# Patient Record
Sex: Male | Born: 1995 | Race: White | Hispanic: No | Marital: Single | Smoking: Never smoker
Health system: Southern US, Community
[De-identification: ages and names within clinical notes are randomized; demographics above are authoritative.]

## PROBLEM LIST (undated history)

## (undated) DIAGNOSIS — F419 Anxiety disorder, unspecified: Secondary | ICD-10-CM

## (undated) HISTORY — DX: Anxiety disorder, unspecified: F41.9

## (undated) HISTORY — PX: OTHER SURGICAL HISTORY: SHX169

---

## 2016-06-09 ENCOUNTER — Emergency Department: Payer: BLUE CROSS/BLUE SHIELD

## 2016-06-09 ENCOUNTER — Encounter: Payer: Self-pay | Admitting: Emergency Medicine

## 2016-06-09 ENCOUNTER — Emergency Department
Admission: EM | Admit: 2016-06-09 | Discharge: 2016-06-09 | Disposition: A | Discharge status: Home / Self Care | Payer: BLUE CROSS/BLUE SHIELD | Attending: Emergency Medicine | Admitting: Emergency Medicine

## 2016-06-09 DIAGNOSIS — K529 Noninfective gastroenteritis and colitis, unspecified: Secondary | ICD-10-CM | POA: Insufficient documentation

## 2016-06-09 DIAGNOSIS — R1031 Right lower quadrant pain: Secondary | ICD-10-CM | POA: Diagnosis present

## 2016-06-09 LAB — CBC
HCT: 45.7 % (ref 40.0–52.0)
Hemoglobin: 16.1 g/dL (ref 13.0–18.0)
MCH: 31.1 pg (ref 26.0–34.0)
MCHC: 35.2 g/dL (ref 32.0–36.0)
MCV: 88.4 fL (ref 80.0–100.0)
Platelets: 259 10*3/uL (ref 150–440)
RBC: 5.17 MIL/uL (ref 4.40–5.90)
RDW: 13.8 % (ref 11.5–14.5)
WBC: 13.9 10*3/uL — ABNORMAL HIGH (ref 3.8–10.6)

## 2016-06-09 LAB — URINALYSIS, COMPLETE (UACMP) WITH MICROSCOPIC
BILIRUBIN URINE: NEGATIVE
Bacteria, UA: NONE SEEN
Glucose, UA: NEGATIVE mg/dL
KETONES UR: NEGATIVE mg/dL
LEUKOCYTES UA: NEGATIVE
Nitrite: NEGATIVE
Protein, ur: 30 mg/dL — AB
Specific Gravity, Urine: 1.026 (ref 1.005–1.030)
Squamous Epithelial / LPF: NONE SEEN
pH: 5 (ref 5.0–8.0)

## 2016-06-09 LAB — COMPREHENSIVE METABOLIC PANEL
ALT: 13 U/L — ABNORMAL LOW (ref 17–63)
AST: 21 U/L (ref 15–41)
Albumin: 5.2 g/dL — ABNORMAL HIGH (ref 3.5–5.0)
Alkaline Phosphatase: 93 U/L (ref 38–126)
Anion gap: 9 (ref 5–15)
BUN: 14 mg/dL (ref 6–20)
CO2: 26 mmol/L (ref 22–32)
Calcium: 9.7 mg/dL (ref 8.9–10.3)
Chloride: 104 mmol/L (ref 101–111)
Creatinine, Ser: 1 mg/dL (ref 0.61–1.24)
GFR calc Af Amer: >60 mL/min (ref >60)
GFR calc non Af Amer: >60 mL/min (ref >60)
Glucose, Bld: 114 mg/dL — ABNORMAL HIGH (ref 65–99)
Potassium: 4.1 mmol/L (ref 3.5–5.1)
Sodium: 139 mmol/L (ref 135–145)
Total Bilirubin: 1.6 mg/dL — ABNORMAL HIGH (ref 0.3–1.2)
Total Protein: 8.5 g/dL — ABNORMAL HIGH (ref 6.5–8.1)

## 2016-06-09 LAB — LIPASE, BLOOD: LIPASE: 14 U/L (ref 11–51)

## 2016-06-09 MED ORDER — METRONIDAZOLE 500 MG PO TABS
500.0000 mg | ORAL_TABLET | Freq: Once | ORAL | Status: AC
  Administered 2016-06-09: 500 mg via ORAL
  Filled 2016-06-09: qty 1

## 2016-06-09 MED ORDER — ONDANSETRON 4 MG PO TBDP
4.0000 mg | ORAL_TABLET | Freq: Once | ORAL | Status: AC | PRN
  Administered 2016-06-09: 4 mg via ORAL
  Filled 2016-06-09: qty 1

## 2016-06-09 MED ORDER — CIPROFLOXACIN HCL 500 MG PO TABS
500.0000 mg | ORAL_TABLET | Freq: Once | ORAL | Status: AC
  Administered 2016-06-09: 500 mg via ORAL
  Filled 2016-06-09: qty 1

## 2016-06-09 MED ORDER — CIPROFLOXACIN HCL 500 MG PO TABS: 500.0000 mg | ORAL_TABLET | Freq: Two times a day (BID) | ORAL | 0 refills | Status: DC

## 2016-06-09 MED ORDER — IOPAMIDOL (ISOVUE-300) INJECTION 61%
30.0000 mL | Freq: Once | INTRAVENOUS | Status: AC | PRN
  Administered 2016-06-09: 30 mL via ORAL
  Filled 2016-06-09: qty 30

## 2016-06-09 MED ORDER — SODIUM CHLORIDE 0.9 % IV BOLUS (SEPSIS)
1000.0000 mL | Freq: Once | INTRAVENOUS | Status: AC
  Administered 2016-06-09: 1000 mL via INTRAVENOUS

## 2016-06-09 MED ORDER — METRONIDAZOLE 500 MG PO TABS: 500.0000 mg | ORAL_TABLET | Freq: Two times a day (BID) | ORAL | 0 refills | Status: DC

## 2016-06-09 MED ORDER — ONDANSETRON HCL 8 MG PO TABS: 8.0000 mg | ORAL_TABLET | Freq: Three times a day (TID) | ORAL | 0 refills | Status: DC | PRN

## 2016-06-09 MED ORDER — ONDANSETRON HCL 4 MG/2ML IJ SOLN
4.0000 mg | Freq: Once | INTRAMUSCULAR | Status: AC
  Administered 2016-06-09: 4 mg via INTRAVENOUS
  Filled 2016-06-09: qty 2

## 2016-06-09 MED ORDER — IOPAMIDOL (ISOVUE-300) INJECTION 61%
100.0000 mL | Freq: Once | INTRAVENOUS | Status: AC | PRN
  Administered 2016-06-09: 100 mL via INTRAVENOUS
  Filled 2016-06-09: qty 100

## 2016-06-09 NOTE — Discharge Instructions (Signed)
Liquid diet for 24 hours. May progress from soft to solid foods as tolerated.

## 2016-06-09 NOTE — ED Triage Notes (Signed)
N/v/d since 4 am. States no one iin the household sick at this time. Pt sitting comfortably in triage

## 2016-06-09 NOTE — ED Provider Notes (Signed)
[facility] Emergency Department Provider Note   ____________________________________________   None    (approximate)  I have reviewed the triage vital signs and the nursing notes.   HISTORY  Chief Complaint Nausea and Diarrhea    HPI Shane ChandlerJake Anderson Romero is a 21 y.o. male patient complaining of nausea vomiting diarrhea since 4 AM this morning. Patient states there is mild right lower quadrant pain. Patient state last vomiting and loose stools prior to arrival to this department. Palliative measures for this complaint. Patient stated nofamily is sick. Patient had taken a flu shot this season. No palliative measures prior to arrival. Patient rates his pain as a 4/10.   History reviewed. No pertinent past medical history.  There are no active problems to display for this patient.   History reviewed. No pertinent surgical history.  Prior to Admission medications   Medication Sig Start Date End Date Taking? Authorizing Provider  ciprofloxacin (CIPRO) 500 MG tablet Take 1 tablet (500 mg total) by mouth 2 (two) times daily. 06/09/16   Joni Reiningonald K Keymari Sato, PA-C  metroNIDAZOLE (FLAGYL) 500 MG tablet Take 1 tablet (500 mg total) by mouth 2 (two) times daily. 06/09/16   Joni Reiningonald K Dameion Briles, PA-C  ondansetron (ZOFRAN) 8 MG tablet Take 1 tablet (8 mg total) by mouth every 8 (eight) hours as needed for nausea or vomiting. 06/09/16   Joni Reiningonald K Kiyah Demartini, PA-C    Allergies Patient has no known allergies.  History reviewed. No pertinent family history.  Social History Social History  Substance Use Topics  . Smoking status: Never Smoker  . Smokeless tobacco: Never Used  . Alcohol use No    Review of Systems Constitutional: No fever/chills Eyes: No visual changes. ENT: No sore throat. Cardiovascular: Denies chest pain. Respiratory: Denies shortness of breath. Gastrointestinal: Mild mid and right lower quadrant abdominal pain.  Nausea vomiting and diarrhea  No  constipation. Genitourinary: Negative for dysuria. Musculoskeletal: Negative for back pain. Skin: Negative for rash. Neurological: Negative for headaches, focal weakness or numbness.    ____________________________________________   PHYSICAL EXAM:  VITAL SIGNS: ED Triage Vitals  Enc Vitals Group     BP 06/09/16 1442 119/85     Pulse Rate 06/09/16 1442 (!) 117     Resp 06/09/16 1442 20     Temp 06/09/16 1442 98.3 F (36.8 C)     Temp Source 06/09/16 1442 Oral     SpO2 06/09/16 1442 100 %     Weight 06/09/16 1442 130 lb (59 kg)     Height 06/09/16 1442 6\' 2"  (1.88 m)     Head Circumference --      Peak Flow --      Pain Score 06/09/16 1514 4     Pain Loc --      Pain Edu? --      Excl. in GC? --     Constitutional: Alert and oriented. Well appearing and in no acute distress. Eyes: Conjunctivae are normal. PERRL. EOMI. Head: Atraumatic. Nose: No congestion/rhinnorhea. Mouth/Throat: Mucous membranes are moist.  Oropharynx non-erythematous. Neck: No stridor.  No cervical spine tenderness to palpation. Hematological/Lymphatic/Immunilogical: No cervical lymphadenopathy. Cardiovascular: Tachycardic. Grossly normal heart sounds.  Good peripheral circulation. Respiratory: Normal respiratory effort.  No retractions. Lungs CTAB. Gastrointestinal: Soft and nontender. No distention. No abdominal bruits. No CVA tenderness. Musculoskeletal: No lower extremity tenderness nor edema.  No joint effusions. Neurologic:  Normal speech and language. No gross focal neurologic deficits are appreciated. No gait instability. Skin:  Skin is warm, dry and intact. No rash noted. Psychiatric: Mood and affect are normal. Speech and behavior are normal.  ____________________________________________   LABS (all labs ordered are listed, but only abnormal results are displayed)  Labs Reviewed  COMPREHENSIVE METABOLIC PANEL - Abnormal; Notable for the following:       Result Value   Glucose, Bld  114 (*)    Total Protein 8.5 (*)    Albumin 5.2 (*)    ALT 13 (*)    Total Bilirubin 1.6 (*)    All other components within normal limits  CBC - Abnormal; Notable for the following:    WBC 13.9 (*)    All other components within normal limits  URINALYSIS, COMPLETE (UACMP) WITH MICROSCOPIC - Abnormal; Notable for the following:    Color, Urine YELLOW (*)    APPearance CLEAR (*)    Hgb urine dipstick MODERATE (*)    Protein, ur 30 (*)    All other components within normal limits  LIPASE, BLOOD   ____________________________________________  EKG   ____________________________________________  RADIOLOGY  CT scan with contrast of abdomen is consistent with colitis. ____________________________________________   PROCEDURES  Procedure(s) performed: None  Procedures  Critical Care performed: No  ____________________________________________   INITIAL IMPRESSION / ASSESSMENT AND PLAN / ED COURSE  Pertinent labs & imaging results that were available during my care of the patient were reviewed by me and considered in my medical decision making (see chart for details).  Colitis. Patient given discharge care instructions. Patient given a prescription for Flagyl and Cipro. Patient advised to follow-up with gastroenterologist if no improvement in 3-5 days. Return to ER if condition worsens.      ____________________________________________   FINAL CLINICAL IMPRESSION(S) / ED DIAGNOSES  Final diagnoses:  Colitis      NEW MEDICATIONS STARTED DURING THIS VISIT:  New Prescriptions   CIPROFLOXACIN (CIPRO) 500 MG TABLET    Take 1 tablet (500 mg total) by mouth 2 (two) times daily.   METRONIDAZOLE (FLAGYL) 500 MG TABLET    Take 1 tablet (500 mg total) by mouth 2 (two) times daily.   ONDANSETRON (ZOFRAN) 8 MG TABLET    Take 1 tablet (8 mg total) by mouth every 8 (eight) hours as needed for nausea or vomiting.     Note:  This document was prepared using Dragon voice  recognition software and may include unintentional dictation errors.    Joni Reining, PA-C 06/09/16 1719    Governor Rooks, MD 06/11/16 [phone]

## 2017-08-17 IMAGING — CT CT ABD-PELV W/ CM
2 of 4 series · 15 of 46 positions shown, 17 images · IV contrast (APPLIED)
Comparison: None.

CLINICAL DATA: Nausea vomiting and diarrhea

EXAM:
CT ABDOMEN AND PELVIS WITH CONTRAST
TECHNIQUE: Multidetector CT imaging of the abdomen and pelvis was performed
using the standard protocol following bolus administration of
intravenous contrast.
CONTRAST:  100mL RFUKUL-666 IOPAMIDOL (RFUKUL-666) INJECTION 61%

[Series 2: routine abd/pel with · axial · 0.63mm/px · z∈[-420,-40]mm · 12 of 90 slices shown, 14 images]
[im 7/90  soft-tissue]
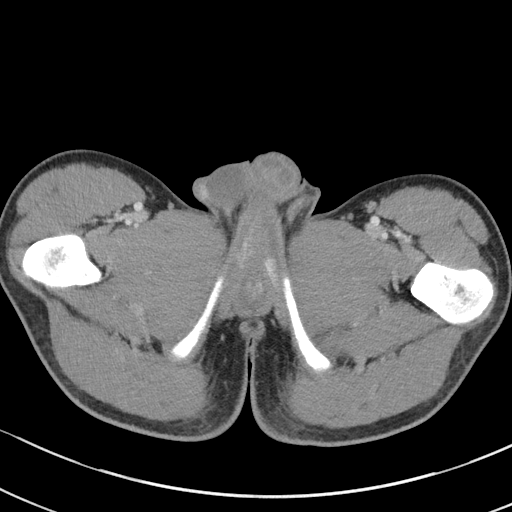
[im 7/90  bone]
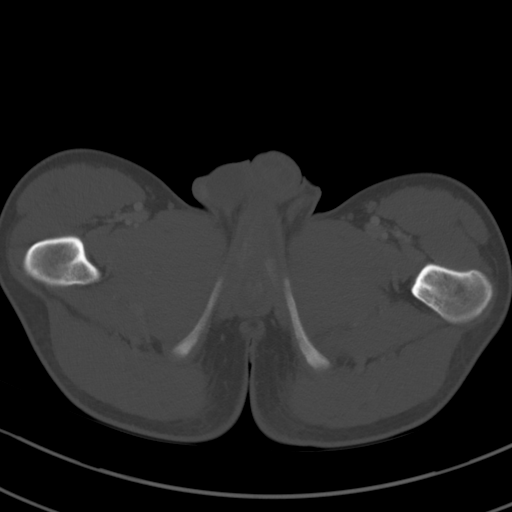
[im 14/90  soft-tissue]
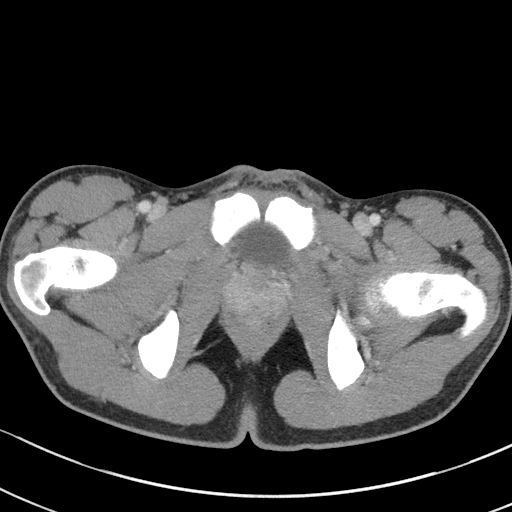
[im 20/90  soft-tissue]
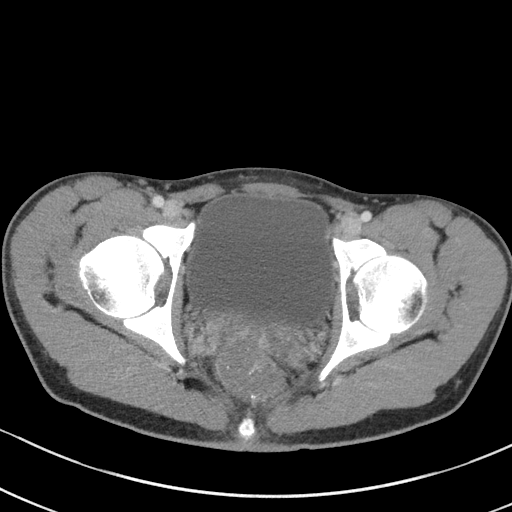
[im 27/90  soft-tissue]
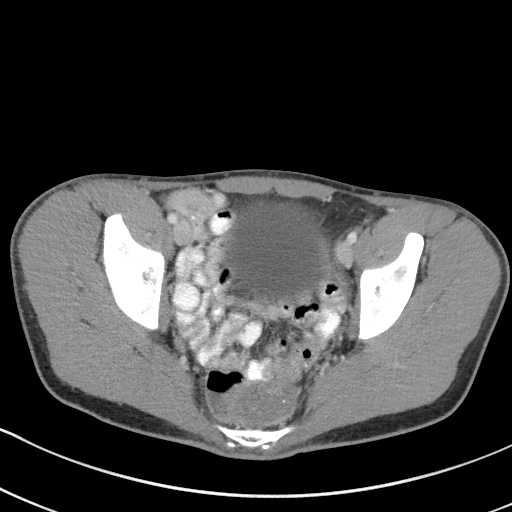
[im 33/90  soft-tissue]
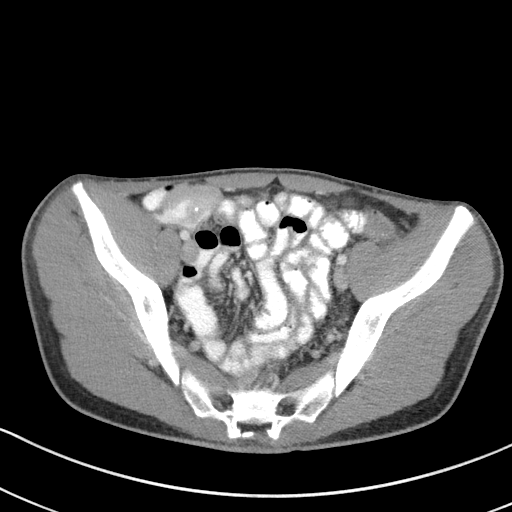
[im 40/90  soft-tissue]
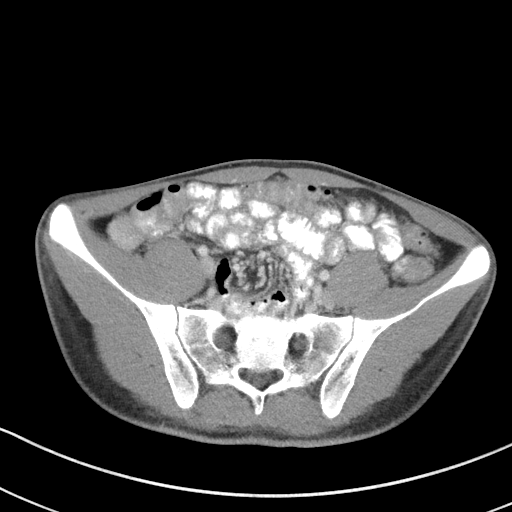
[im 50/90  soft-tissue]
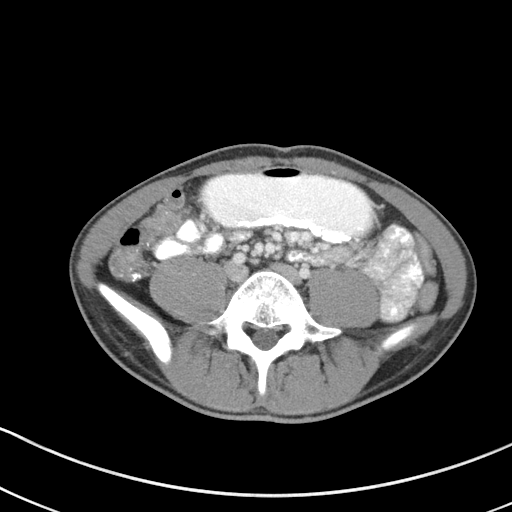
[im 57/90  soft-tissue]
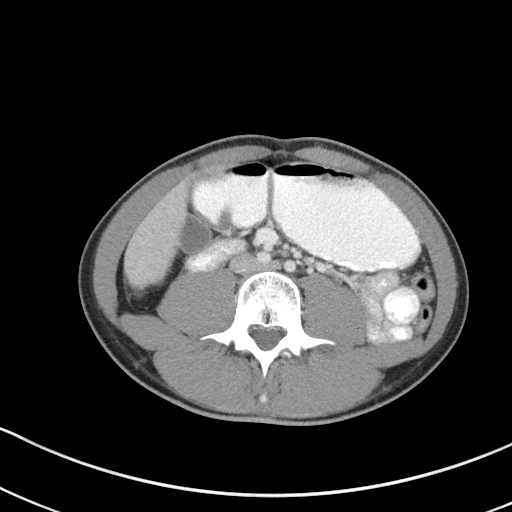
[im 63/90  soft-tissue]
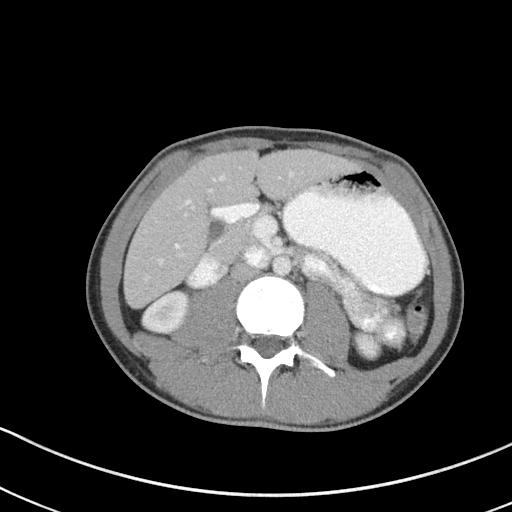
[im 63/90  bone]
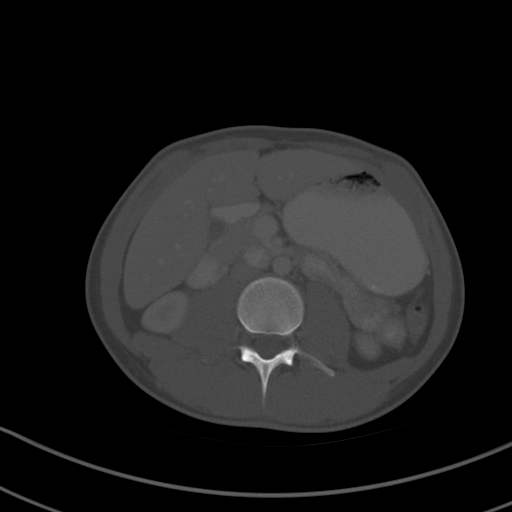
[im 70/90  soft-tissue]
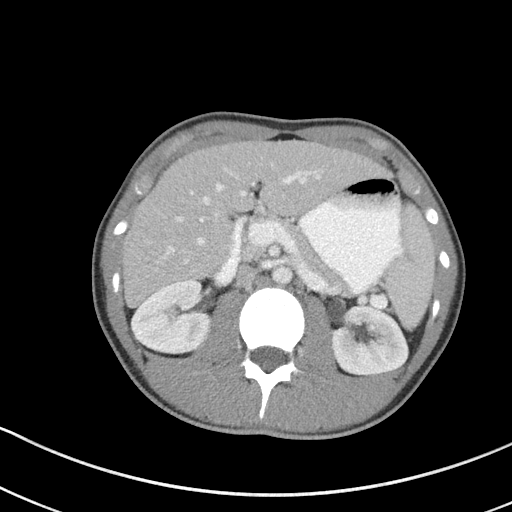
[im 76/90  soft-tissue]
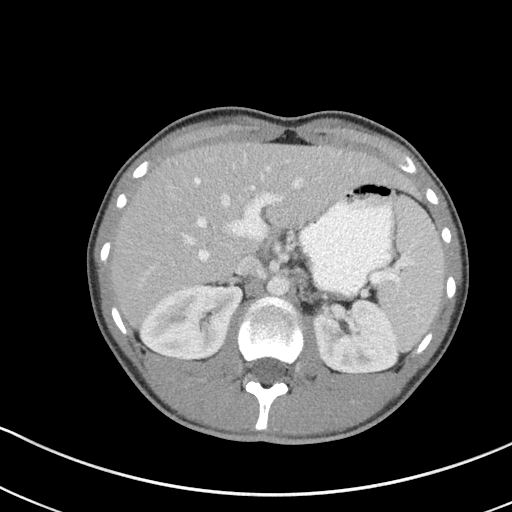
[im 83/90  soft-tissue]
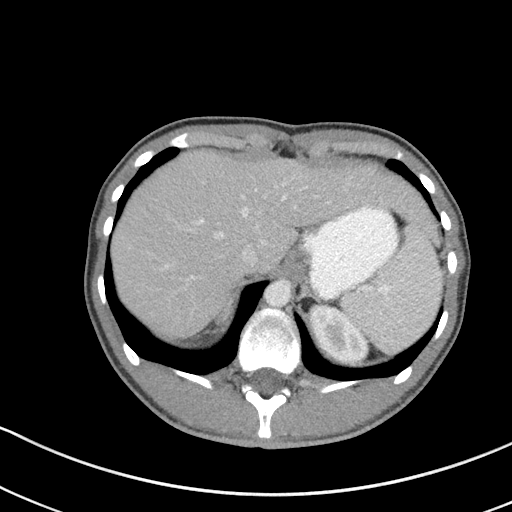

[Series 5: coronal st · coronal · 0.59mm/px · 3 of 82 slices shown]
[im 28/82  soft-tissue]
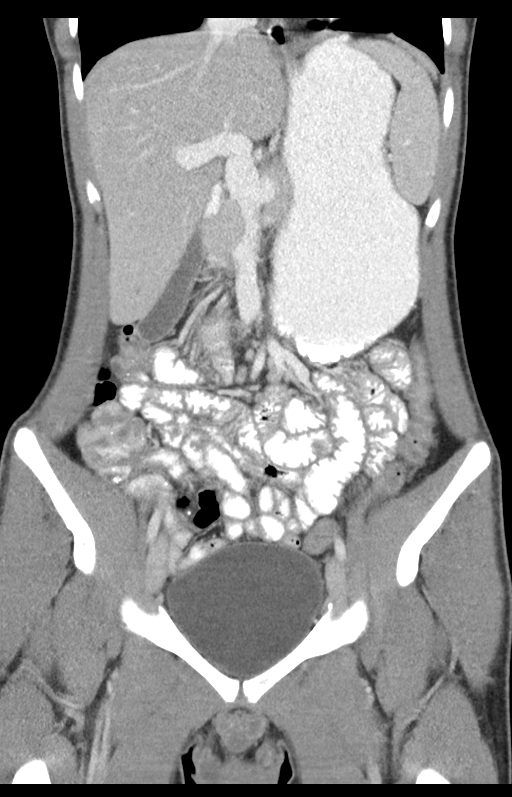
[im 37/82  soft-tissue]
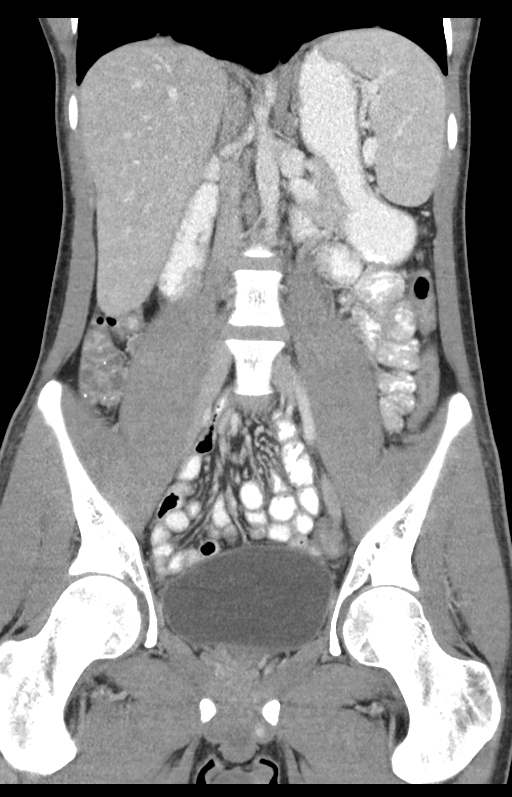
[im 46/82  soft-tissue]
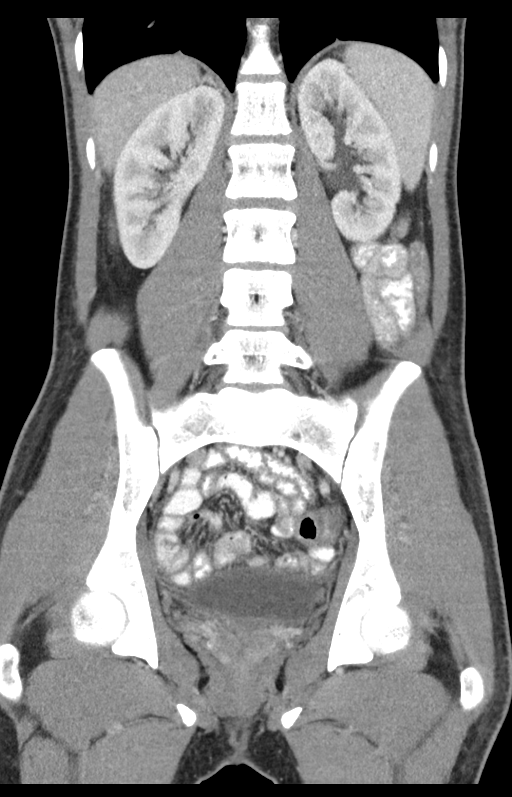

[15 of 46 positions shown; findings below may reference images not displayed]

FINDINGS: Lower chest: No acute abnormality.

Hepatobiliary: No focal liver abnormality is seen. No gallstones,
gallbladder wall thickening, or biliary dilatation.

Pancreas: Unremarkable. No pancreatic ductal dilatation or
surrounding inflammatory changes.

Spleen: Normal in size without focal abnormality.

Adrenals/Urinary Tract: Adrenal glands are unremarkable. Kidneys are
normal, without renal calculi, focal lesion, or hydronephrosis.
Bladder is unremarkable.

Stomach/Bowel: Stomach is within normal limits. Appendix appears
normal. Diffuse colon wall thickening.

Vascular/Lymphatic: No significant vascular findings are present. No
enlarged abdominal or pelvic lymph nodes.

Reproductive: Prostate is unremarkable.

Other: No abdominal wall hernia or abnormality. No abdominopelvic
ascites.

Musculoskeletal: No acute or significant osseous findings.
IMPRESSION: 1. Mild diffuse wall thickening of the colon, suggesting colitis of
infectious or inflammatory etiology.
2. Otherwise negative examination.

## 2017-11-02 ENCOUNTER — Encounter: Payer: Self-pay | Admitting: Nurse Practitioner

## 2017-11-06 ENCOUNTER — Ambulatory Visit (INDEPENDENT_AMBULATORY_CARE_PROVIDER_SITE_OTHER): Payer: BLUE CROSS/BLUE SHIELD | Admitting: Adult Health

## 2017-11-06 ENCOUNTER — Encounter: Payer: Self-pay | Admitting: Adult Health

## 2017-11-06 VITALS — BP 112/74 | HR 84 | Resp 16 | Ht 76.0 in | Wt 139.0 lb

## 2017-11-06 DIAGNOSIS — R300 Dysuria: Secondary | ICD-10-CM | POA: Diagnosis not present

## 2017-11-06 DIAGNOSIS — Z114 Encounter for screening for human immunodeficiency virus [HIV]: Secondary | ICD-10-CM

## 2017-11-06 DIAGNOSIS — Z0001 Encounter for general adult medical examination with abnormal findings: Secondary | ICD-10-CM

## 2017-11-06 NOTE — Patient Instructions (Signed)

## 2017-11-06 NOTE — Progress Notes (Signed)
[facility] [address] [place], [place] [address]  Internal MEDICINE  Office Visit Note  Patient Name: Shane Romero  [id number]  [id number]  Date of Service: 11/06/2017  Chief Complaint  Patient presents with  . Annual Exam     HPI Pt is here for routine health maintenance examination.  He denies issues currently. He is a Chief Operating Officer at [employer]. He is generally healthy. No concerns today.  Needs routine blood work done.     Current Medication: Outpatient Encounter Medications as of 11/06/2017  Medication Sig  . [DISCONTINUED] ciprofloxacin (CIPRO) 500 MG tablet Take 1 tablet (500 mg total) by mouth 2 (two) times daily. (Patient not taking: Reported on 11/06/2017)  . [DISCONTINUED] metroNIDAZOLE (FLAGYL) 500 MG tablet Take 1 tablet (500 mg total) by mouth 2 (two) times daily. (Patient not taking: Reported on 11/06/2017)  . [DISCONTINUED] ondansetron (ZOFRAN) 8 MG tablet Take 1 tablet (8 mg total) by mouth every 8 (eight) hours as needed for nausea or vomiting. (Patient not taking: Reported on 11/06/2017)   No facility-administered encounter medications on file as of 11/06/2017.     Surgical History: Past Surgical History:  Procedure Laterality Date  . none    . wisdome teeth extraction      Medical History: Past Medical History:  Diagnosis Date  . Anxiety     Family History: Family History  Problem Relation Age of Onset  . Hypertension Father   . Diabetes Maternal Grandfather       Review of Systems  Constitutional: Negative.  Negative for chills, fatigue and unexpected weight change.  HENT: Negative.  Negative for congestion, rhinorrhea, sneezing and sore throat.   Eyes: Negative for redness.  Respiratory: Negative.  Negative for cough, chest tightness and shortness of breath.   Cardiovascular: Negative.  Negative for chest pain and palpitations.  Gastrointestinal: Negative.  Negative for abdominal  pain, constipation, diarrhea, nausea and vomiting.  Endocrine: Negative.   Genitourinary: Negative.  Negative for dysuria and frequency.  Musculoskeletal: Negative.  Negative for arthralgias, back pain, joint swelling and neck pain.  Skin: Negative.  Negative for rash.  Allergic/Immunologic: Negative.   Neurological: Negative.  Negative for tremors and numbness.  Hematological: Negative for adenopathy. Does not bruise/bleed easily.  Psychiatric/Behavioral: Negative.  Negative for behavioral problems, sleep disturbance and suicidal ideas. The patient is not nervous/anxious.      Vital Signs: BP 112/74   Pulse 84   Resp 16   Ht 6\' 4"  (1.93 m)   Wt 139 lb (63 kg)   SpO2 98%   BMI 16.92 kg/m    Physical Exam  Constitutional: He is oriented to person, place, and time. He appears well-developed and well-nourished. No distress.  HENT:  Head: Normocephalic and atraumatic.  Mouth/Throat: Oropharynx is clear and moist. No oropharyngeal exudate.  Eyes: Pupils are equal, round, and reactive to light. EOM are normal.  Neck: Normal range of motion. Neck supple. No JVD present. No tracheal deviation present. No thyromegaly present.  Cardiovascular: Normal rate, regular rhythm and normal heart sounds. Exam reveals no gallop and no friction rub.  No murmur heard. Pulmonary/Chest: Effort normal and breath sounds normal. No respiratory distress. He has no wheezes. He has no rales. He exhibits no tenderness.  Abdominal: Soft. There is no tenderness. There is no guarding. Hernia confirmed negative in the right inguinal area and confirmed negative in the left inguinal area.  Genitourinary: Testes normal. Cremasteric reflex is present.  Musculoskeletal: Normal  range of motion.  Lymphadenopathy:    He has no cervical adenopathy. No inguinal adenopathy noted on the right or left side.  Neurological: He is alert and oriented to person, place, and time. No cranial nerve deficit.  Skin: Skin is warm and  dry. He is not diaphoretic.  Psychiatric: He has a normal mood and affect. His behavior is normal. Judgment and thought content normal.  Nursing note and vitals reviewed.   LABS: No results found for this or any previous visit (from the past 2160 hour(s)).  Assessment/Plan: 1. Encounter for general adult medical examination with abnormal findings - CBC with Differential/Platelet - Lipid Panel With LDL/HDL Ratio - TSH - T4, free - Comprehensive metabolic panel  2. Dysuria - UA/M w/rflx Culture, Routine  3. Screening for HIV (human immunodeficiency virus) - HIV antibody (with reflex)  No risk factors.  General Counseling: Hendrix verbalizes understanding of the findings of todays visit and agrees with plan of treatment. I have discussed any further diagnostic evaluation that may be needed or ordered today. We also reviewed his medications today. he has been encouraged to call the office with any questions or concerns that should arise related to todays visit.   Orders Placed This Encounter  Procedures  . UA/M w/rflx Culture, Routine    No orders of the defined types were placed in this encounter.   Time spent: 25 Minutes   This patient was seen by Blima LedgerAdam Korie Brabson AGNP-C in Collaboration with Dr Lyndon CodeFozia M Khan as a part of collaborative care agreement   Lyndon CodeFozia M Khan, MD  Internal Medicine

## 2017-11-07 LAB — COMPREHENSIVE METABOLIC PANEL
ALBUMIN: 5.4 g/dL (ref 3.5–5.5)
ALK PHOS: 82 IU/L (ref 39–117)
ALT: 11 IU/L (ref 0–44)
AST: 11 IU/L (ref 0–40)
Albumin/Globulin Ratio: 2.2 (ref 1.2–2.2)
BUN/Creatinine Ratio: 12 (ref 9–20)
BUN: 10 mg/dL (ref 6–20)
Bilirubin Total: 0.5 mg/dL (ref 0.0–1.2)
CHLORIDE: 99 mmol/L (ref 96–106)
CO2: 26 mmol/L (ref 20–29)
CREATININE: 0.84 mg/dL (ref 0.76–1.27)
Calcium: 9.9 mg/dL (ref 8.7–10.2)
GFR calc Af Amer: 145 mL/min/{1.73_m2} (ref >59)
GFR, EST NON AFRICAN AMERICAN: 125 mL/min/{1.73_m2} (ref >59)
GLUCOSE: 86 mg/dL (ref 65–99)
Globulin, Total: 2.5 g/dL (ref 1.5–4.5)
POTASSIUM: 4.4 mmol/L (ref 3.5–5.2)
Sodium: 141 mmol/L (ref 134–144)
Total Protein: 7.9 g/dL (ref 6.0–8.5)

## 2017-11-07 LAB — UA/M W/RFLX CULTURE, ROUTINE
BILIRUBIN UA: NEGATIVE
Bilirubin, UA: NEGATIVE
GLUCOSE, UA: NEGATIVE
Glucose, UA: NEGATIVE
KETONES UA: NEGATIVE
Ketones, UA: NEGATIVE
Leukocytes, UA: NEGATIVE
Leukocytes, UA: NEGATIVE
NITRITE UA: NEGATIVE
NITRITE UA: NEGATIVE
PROTEIN UA: NEGATIVE
Protein, UA: NEGATIVE
RBC UA: NEGATIVE
RBC UA: NEGATIVE
SPEC GRAV UA: 1.016 (ref 1.005–1.030)
Specific Gravity, UA: 1.016 (ref 1.005–1.030)
UUROB: 0.2 mg/dL (ref 0.2–1.0)
Urobilinogen, Ur: 0.2 mg/dL (ref 0.2–1.0)
pH, UA: 7.5 (ref 5.0–7.5)
pH, UA: 6.5 (ref 5.0–7.5)

## 2017-11-07 LAB — HIV ANTIBODY (ROUTINE TESTING W REFLEX): HIV Screen 4th Generation wRfx: NONREACTIVE

## 2017-11-07 LAB — LIPID PANEL WITH LDL/HDL RATIO
CHOLESTEROL TOTAL: 174 mg/dL (ref 100–199)
HDL: 48 mg/dL (ref >39)
LDL Calculated: 95 mg/dL (ref 0–99)
LDL/HDL RATIO: 2 ratio (ref 0.0–3.6)
TRIGLYCERIDES: 155 mg/dL — AB (ref 0–149)
VLDL Cholesterol Cal: 31 mg/dL (ref 5–40)

## 2017-11-07 LAB — MICROSCOPIC EXAMINATION
Bacteria, UA: NONE SEEN
Bacteria, UA: NONE SEEN
Casts: NONE SEEN /lpf
Casts: NONE SEEN /lpf
EPITHELIAL CELLS (NON RENAL): NONE SEEN /HPF (ref 0–10)
Epithelial Cells (non renal): NONE SEEN /hpf (ref 0–10)
WBC, UA: NONE SEEN /hpf (ref 0–5)

## 2017-11-07 LAB — CBC WITH DIFFERENTIAL/PLATELET
Basophils Absolute: 0 10*3/uL (ref 0.0–0.2)
Basos: 0 %
EOS (ABSOLUTE): 0.1 10*3/uL (ref 0.0–0.4)
Eos: 1 %
HEMATOCRIT: 42.8 % (ref 37.5–51.0)
Hemoglobin: 14.5 g/dL (ref 13.0–17.7)
IMMATURE GRANULOCYTES: 0 %
Immature Grans (Abs): 0 10*3/uL (ref 0.0–0.1)
LYMPHS ABS: 2.2 10*3/uL (ref 0.7–3.1)
Lymphs: 28 %
MCH: 29.9 pg (ref 26.6–33.0)
MCHC: 33.9 g/dL (ref 31.5–35.7)
MCV: 88 fL (ref 79–97)
MONOS ABS: 0.4 10*3/uL (ref 0.1–0.9)
Monocytes: 5 %
NEUTROS PCT: 66 %
Neutrophils Absolute: 5.2 10*3/uL (ref 1.4–7.0)
Platelets: 263 10*3/uL (ref 150–450)
RBC: 4.85 x10E6/uL (ref 4.14–5.80)
RDW: 13.6 % (ref 12.3–15.4)
WBC: 7.9 10*3/uL (ref 3.4–10.8)

## 2017-11-07 LAB — TSH: TSH: 3.26 u[IU]/mL (ref 0.450–4.500)

## 2017-11-07 LAB — T4, FREE: Free T4: 1.08 ng/dL (ref 0.82–1.77)

## 2017-11-09 ENCOUNTER — Encounter: Payer: Self-pay | Admitting: Adult Health

## 2018-11-08 ENCOUNTER — Encounter: Payer: Self-pay | Admitting: Adult Health
